# Patient Record
Sex: Female | Born: 1960 | Race: White | Hispanic: No | Marital: Married
Health system: Southern US, Community
[De-identification: ages and names within clinical notes are randomized; demographics above are authoritative.]

## PROBLEM LIST (undated history)

## (undated) DIAGNOSIS — M359 Systemic involvement of connective tissue, unspecified: Secondary | ICD-10-CM

## (undated) DIAGNOSIS — D680 Von Willebrand's disease: Principal | ICD-10-CM

## (undated) DIAGNOSIS — I10 Essential (primary) hypertension: Secondary | ICD-10-CM

## (undated) HISTORY — DX: Systemic involvement of connective tissue, unspecified: M35.9

## (undated) HISTORY — DX: Von Willebrand's disease: D68.0

---

## 2006-06-30 ENCOUNTER — Encounter: Admission: RE | Admit: 2006-06-30 | Discharge: 2006-06-30 | Payer: Self-pay | Admitting: Sports Medicine

## 2006-10-06 ENCOUNTER — Ambulatory Visit: Payer: Self-pay | Admitting: Oncology

## 2006-10-14 ENCOUNTER — Ambulatory Visit (HOSPITAL_BASED_OUTPATIENT_CLINIC_OR_DEPARTMENT_OTHER): Admission: RE | Admit: 2006-10-14 | Discharge: 2006-10-15 | Payer: Self-pay | Admitting: Orthopedic Surgery

## 2008-11-14 ENCOUNTER — Encounter: Admission: RE | Admit: 2008-11-14 | Discharge: 2008-11-14 | Payer: Self-pay | Admitting: Orthopaedic Surgery

## 2010-10-05 NOTE — Op Note (Signed)
[name], [name]           ACCOUNT NO.:  [id number]   MEDICAL RECORD NO.:  [id number]          PATIENT TYPE:  AMB   LOCATION:  DSC                          FACILITY:  [facility]   PHYSICIAN:  Leonides Grills, M.D.     DATE OF BIRTH:  12-14-1960   DATE OF PROCEDURE:  10/14/2006  DATE OF DISCHARGE:                               OPERATIVE REPORT   PREOPERATIVE DIAGNOSES:  1. 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  2. Right intercuneiform arthritis.   POSTOPERATIVE DIAGNOSES:  1. 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  2. Right intercuneiform arthritis.   OPERATION:  1. Right first TMT joint fusion with osteotomy.  2. Right second TMT joint fusion without osteotomy.  3. Right local bone graft.  4. Right intercuneiform fusion.  5. Right stress x-rays foot.   ANESTHESIA:  General with block.   SURGEON:  Leonides Grills, MD   ASSISTANT:  Evlyn Kanner, P.A.   ESTIMATED BLOOD LOSS:  Minimal.   TOURNIQUET TIME:  Approximately an hour and 50 minutes.   COMPLICATIONS:  None.   DISPOSITION:  Stable to PR.   INDICATIONS:  This is a 50 year old female who sustained a Lisfranc  fracture subluxation previously and has since had persistent pain in her  medial right mid foot.  She was consented for the above procedure.  All  risks which include infection, nerve or vessel injury, nonunion,  malunion, hardware irritation, hardware failure, persistent pain, worse  pain, prolonged recovery, stiffness, arthritis, bleeding complications  due to her von Willebrand disease were all explained.  Questions were  encouraged and answered.   OPERATION:  The patient brought to the operating placed in supine  position initially after adequate general G tube anesthesia with block  was administered as well as DDAVP Ancef 1 gram IV piggyback.  The right  lower extremity is prepped and  draped in sterile manner over proximally  thigh tourniquet.  Limb was gravity exsanguinated.  Tourniquet was  elevated to 290 mmHg.  A longitudinal incision midline between the right  EHL and EHB was then made.  Dissection was carried down through skin.  Hemostasis was obtained.  Vascular structures protected throughout the  case.  Interval between EHL and EHB was then developed.  Soft tissue was  elevated off the dorsal aspect of first and second TMT joint as well as  inner cuneiform joint.  The area of the first TMT joint was eroded and  required a subtle osteotomy to correct the first TMT joint in anatomic  position.  This was done with a sagittal saw.  Once this was performed,  remaining cartilage removed with a curved quarter inch osteotome curette  as well as a synovectomy rongeur.  The remaining cartilage from the  inner cuneiform area as well as the second TMT joint was removed with  curved quarter inch osteotome curette and synovectomy rongeur.  Multiple  2-mm drill holes placed on either side of the joint.  First TMT joint  was  reduced anatomically with a two-point reduction clamp.  A bur was  used to create a notch the base of first metatarsal and a 3.5-mm fully  threaded cortical lag screw was then placed using 3.5 and 2.5 mm drill  holes respectively.  This had excellent purchase and compression across  the first TMT joint.  We then made a small incision medially  percutaneous over the medial cuneiform and with a two-point reduction  clamp, the second TMT joint was anatomically compressed and reduced.  A  3.5-mm fully threaded cortical set screw then placed using 2.5-mm drill  hole respectively.  This excellent purchase and maintenance of the  compression across fusion site.  We then placed two-point reduction  clamp across the intercuneiform area and then through the dorsal wound a  2.5 mm drill hole was placed across the intercuneiform area and a 3.5-mm  fully threaded  cortical set screw was placed using a 2.5 mm drill hole  respectively.  This had excellent purchase and maintenance of the  compression. Local bone graft obtained throughout the procedure from the  spur as well as osteotomy and the drills were then placed on back table  and then stress strain relieving bone graft was applied to all fusion  surfaces dorsally using a bur.  Final stress x-rays were obtained AP  lateral and oblique planes.  This showed no gross motion across the  fusion site, fixation in proper position excellent alignment as well.  The tourniquet was deflated, hemostasis was obtained.  Prior to bone  grafting the area was copiously irrigated normal saline.  Subcu was  closed 3-0 Vicryl, skin was closed 4-0 nylon over all wounds.  Sterile  dressing was applied.  Modified Jones dressing was applied ankle in  neutral dorsiflexion.  Patient stable to PR.   POSTOP COURSE:  The patient will be admitted overnight.  She is to  receive DDAVP 12 hours post surgery which will be 3 a.m. next day and  received a final dose of DDAVP 29 mcg 24 hours after surgery.  This was  arranged by home health.  She will then follow up in 2 weeks at that  time remove the dressing as well as suture.  She will then go into short-  leg nonweightbearing cast which she will wear for 6 weeks.  At two  months postop, she will go into a Cam walker boot and weight bear as  tolerated and again get compression stocking.  She will undergo therapy  for active passive range of motion excise, the ankle talar and MTP  joints.  At the end of 3 months she will go into a normal shoe, at the  end of four months she will require custom-made orthotic.  Elevation  active range of motion toes were encouraged.      Leonides Grills, M.D.  Electronically Signed     PB/MEDQ  D:  10/14/2006  T:  10/14/2006  Job:  [id number]   cc:   Weston Settle, MD

## 2013-07-05 DIAGNOSIS — I10 Essential (primary) hypertension: Secondary | ICD-10-CM | POA: Insufficient documentation

## 2013-07-05 DIAGNOSIS — R000 Tachycardia, unspecified: Secondary | ICD-10-CM | POA: Insufficient documentation

## 2013-07-05 DIAGNOSIS — G894 Chronic pain syndrome: Secondary | ICD-10-CM | POA: Insufficient documentation

## 2013-07-05 DIAGNOSIS — M545 Low back pain, unspecified: Secondary | ICD-10-CM | POA: Insufficient documentation

## 2013-08-02 DIAGNOSIS — H43819 Vitreous degeneration, unspecified eye: Secondary | ICD-10-CM | POA: Insufficient documentation

## 2013-09-15 DIAGNOSIS — H539 Unspecified visual disturbance: Secondary | ICD-10-CM | POA: Insufficient documentation

## 2013-09-15 DIAGNOSIS — Z9889 Other specified postprocedural states: Secondary | ICD-10-CM | POA: Insufficient documentation

## 2013-09-15 DIAGNOSIS — H5710 Ocular pain, unspecified eye: Secondary | ICD-10-CM | POA: Insufficient documentation

## 2013-10-27 DIAGNOSIS — G894 Chronic pain syndrome: Secondary | ICD-10-CM | POA: Insufficient documentation

## 2013-10-27 DIAGNOSIS — M797 Fibromyalgia: Secondary | ICD-10-CM | POA: Insufficient documentation

## 2013-10-27 DIAGNOSIS — M7918 Myalgia, other site: Secondary | ICD-10-CM | POA: Insufficient documentation

## 2013-10-27 DIAGNOSIS — M359 Systemic involvement of connective tissue, unspecified: Secondary | ICD-10-CM

## 2013-10-27 DIAGNOSIS — M2550 Pain in unspecified joint: Secondary | ICD-10-CM | POA: Insufficient documentation

## 2013-10-27 HISTORY — DX: Systemic involvement of connective tissue, unspecified: M35.9

## 2014-02-14 DIAGNOSIS — G43109 Migraine with aura, not intractable, without status migrainosus: Secondary | ICD-10-CM | POA: Insufficient documentation

## 2016-03-20 ENCOUNTER — Telehealth: Payer: Self-pay | Admitting: *Deleted

## 2016-03-20 NOTE — Telephone Encounter (Signed)
error 

## 2016-04-05 ENCOUNTER — Ambulatory Visit (HOSPITAL_BASED_OUTPATIENT_CLINIC_OR_DEPARTMENT_OTHER): Payer: BC Managed Care – PPO | Admitting: Hematology & Oncology

## 2016-04-05 ENCOUNTER — Ambulatory Visit: Payer: BC Managed Care – PPO

## 2016-04-05 ENCOUNTER — Encounter: Payer: Self-pay | Admitting: Hematology & Oncology

## 2016-04-05 ENCOUNTER — Other Ambulatory Visit (HOSPITAL_BASED_OUTPATIENT_CLINIC_OR_DEPARTMENT_OTHER): Payer: BC Managed Care – PPO

## 2016-04-05 VITALS — BP 142/72 | HR 76 | Temp 97.7°F | Resp 20 | Wt 183.1 lb

## 2016-04-05 DIAGNOSIS — D680 Von Willebrand's disease: Secondary | ICD-10-CM

## 2016-04-05 DIAGNOSIS — D689 Coagulation defect, unspecified: Secondary | ICD-10-CM

## 2016-04-05 DIAGNOSIS — D6801 Von willebrand disease, type 1: Secondary | ICD-10-CM

## 2016-04-05 HISTORY — DX: Von Willebrand's disease: D68.0

## 2016-04-05 HISTORY — DX: Von willebrand disease, type 1: D68.01

## 2016-04-05 LAB — CBC WITH DIFFERENTIAL (CANCER CENTER ONLY)
BASO#: 0 10*3/uL (ref 0.0–0.2)
BASO%: 0.5 % (ref 0.0–2.0)
EOS%: 2.9 % (ref 0.0–7.0)
Eosinophils Absolute: 0.2 10*3/uL (ref 0.0–0.5)
HEMATOCRIT: 39.5 % (ref 34.8–46.6)
HEMOGLOBIN: 13.4 g/dL (ref 11.6–15.9)
LYMPH#: 1.9 10*3/uL (ref 0.9–3.3)
LYMPH%: 29 % (ref 14.0–48.0)
MCH: 30.3 pg (ref 26.0–34.0)
MCHC: 33.9 g/dL (ref 32.0–36.0)
MCV: 89 fL (ref 81–101)
MONO#: 0.5 10*3/uL (ref 0.1–0.9)
MONO%: 8 % (ref 0.0–13.0)
NEUT%: 59.6 % (ref 39.6–80.0)
NEUTROS ABS: 3.9 10*3/uL (ref 1.5–6.5)
Platelets: 350 10*3/uL (ref 145–400)
RBC: 4.42 10*6/uL (ref 3.70–5.32)
RDW: 12.1 % (ref 11.1–15.7)
WBC: 6.6 10*3/uL (ref 3.9–10.0)

## 2016-04-06 LAB — VON WILLEBRAND PANEL
FACTOR VIII ACTIVITY: 82 % (ref 57–163)
vWF Activity: 72 % (ref 50–200)
von Willebrand Factor (vWF) Ag: 92 % (ref 50–200)

## 2016-04-06 LAB — PROTHROMBIN TIME (PT)
INR: 1.1 (ref 0.8–1.2)
Prothrombin Time: 11.9 s (ref 9.1–12.0)

## 2016-04-06 LAB — APTT: APTT: 27 s (ref 24–33)

## 2016-04-08 ENCOUNTER — Ambulatory Visit (HOSPITAL_BASED_OUTPATIENT_CLINIC_OR_DEPARTMENT_OTHER): Payer: BC Managed Care – PPO | Admitting: Hematology & Oncology

## 2016-04-08 ENCOUNTER — Other Ambulatory Visit: Payer: Self-pay | Admitting: Hematology & Oncology

## 2016-04-08 ENCOUNTER — Encounter: Payer: Self-pay | Admitting: *Deleted

## 2016-04-08 ENCOUNTER — Other Ambulatory Visit (HOSPITAL_COMMUNITY)
Admission: RE | Admit: 2016-04-08 | Discharge: 2016-04-08 | Disposition: A | Discharge status: Home / Self Care | Payer: BC Managed Care – PPO | Source: Ambulatory Visit | Attending: Hematology & Oncology | Admitting: Hematology & Oncology

## 2016-04-08 ENCOUNTER — Ambulatory Visit (HOSPITAL_BASED_OUTPATIENT_CLINIC_OR_DEPARTMENT_OTHER)
Admission: RE | Admit: 2016-04-08 | Discharge: 2016-04-08 | Disposition: A | Discharge status: Home / Self Care | Payer: BC Managed Care – PPO | Source: Ambulatory Visit | Attending: Hematology & Oncology | Admitting: Hematology & Oncology

## 2016-04-08 ENCOUNTER — Encounter (HOSPITAL_BASED_OUTPATIENT_CLINIC_OR_DEPARTMENT_OTHER): Payer: Self-pay

## 2016-04-08 ENCOUNTER — Ambulatory Visit (HOSPITAL_BASED_OUTPATIENT_CLINIC_OR_DEPARTMENT_OTHER): Payer: BC Managed Care – PPO

## 2016-04-08 DIAGNOSIS — E0789 Other specified disorders of thyroid: Secondary | ICD-10-CM

## 2016-04-08 DIAGNOSIS — D680 Von Willebrand's disease: Secondary | ICD-10-CM | POA: Insufficient documentation

## 2016-04-08 DIAGNOSIS — D6801 Von willebrand disease, type 1: Secondary | ICD-10-CM

## 2016-04-08 DIAGNOSIS — D689 Coagulation defect, unspecified: Secondary | ICD-10-CM

## 2016-04-08 DIAGNOSIS — E042 Nontoxic multinodular goiter: Secondary | ICD-10-CM | POA: Insufficient documentation

## 2016-04-08 DIAGNOSIS — E079 Disorder of thyroid, unspecified: Secondary | ICD-10-CM

## 2016-04-08 HISTORY — DX: Essential (primary) hypertension: I10

## 2016-04-08 LAB — PLATELET FUNCTION ASSAY: COLLAGEN / ADP: 97 s (ref 0–118)

## 2016-04-08 MED ORDER — IOPAMIDOL (ISOVUE-300) INJECTION 61%
100.0000 mL | Freq: Once | INTRAVENOUS | Status: AC | PRN
  Administered 2016-04-08: 100 mL via INTRAVENOUS

## 2016-04-08 NOTE — Progress Notes (Signed)
Referral MD  Reason for Referral: Bleeding disorder - Patient needs an EGD  Chief Complaint  Patient presents with  . Initial Assessment    Clotting Issues  : I have a problem bleeding when I have procedures.  HPI: [name] is well-known to me. She is the wife of my patient. She is 55 years old. She needs to have an upper endoscopy and biopsy. I'm assured that she has some type of polyp or pathology in the upper abdomen.  She says that she has had issues with bleeding in the past. She did have a thyroid biopsy back in August 2015. As far as I can tell, there was no issues with bleeding from this. She has fibro-myalgia. She has had injections for this. Again I don't see any obvious bleeding complication.  From the records that are in the system, I do not see any coagulation studies.  She says that she has some kind of bleeding problem. In the chart, she is recorded as having von Willebrand's. I have not seen anything in the past office notes from other doctors about her having von Willebrand's.   She says that she has had a lot of bleeding with procedures. She has had I think a couple C-sections.  She says that she almost died from bleeding.   I'm not sure if there is anybody in her family that is a free bleeder. She says that her mother may have had some bleeding problems.   [facility].   Her performance status is ECOG 0.    Past Medical History:  Diagnosis Date  . Von Willebrand disease, type 1a (HCC) 04/05/2016  :  No past surgical history on file.:   Current Outpatient Prescriptions:  .  carisoprodol (SOMA) 250 MG tablet, Take 250 mg by mouth daily., Disp: , Rfl:  .  clonazePAM (KLONOPIN) 1 MG tablet, Take 1 mg by mouth., Disp: , Rfl:  .  COMFORT GEL ANTACID & ANTI-GAS 200-200-20 MG/5ML suspension, , Disp: , Rfl:  .  DEXILANT 60 MG capsule, 60 mg daily., Disp: , Rfl:  .  DULoxetine (CYMBALTA) 60 MG capsule,  Take 60 mg by mouth daily. , Disp: , Rfl:  .  EPINEPHrine 0.3 mg/0.3 mL IJ SOAJ injection, Inject into the muscle., Disp: , Rfl:  .  hydrochlorothiazide (HYDRODIURIL) 25 MG tablet, Take 25 mg by mouth daily., Disp: , Rfl:  .  lidocaine (LIDODERM) 5 %, APPLY 1 PATCH BY TRANSDERMAL ROUTE EVERY DAY (MAY WEAR UP TO 12HOURS.), Disp: , Rfl:  .  LINZESS 290 MCG CAPS capsule, Take 290 mcg by mouth daily., Disp: , Rfl: 2 .  metoprolol succinate (TOPROL-XL) 25 MG 24 hr tablet, Take 25 mg by mouth., Disp: , Rfl:  .  omeprazole (PRILOSEC) 40 MG capsule, Take 40 mg by mouth daily., Disp: , Rfl: :  :  Allergies  Allergen Reactions  . Bee Venom Anaphylaxis  . Meperidine Rash and Swelling  . Methocarbamol Nausea And Vomiting    Other reaction(s): Dizziness (intolerance)  . Nitrofurantoin Rash  . Oxycodone Nausea And Vomiting  . Aspirin Other (See Comments)    Patient reports this causes severe bleeding with this med  . Hydrocortisone Hives  . Ibuprofen Other (See Comments)    Cannot take d/t clotting issues  . Gabapentin Nausea Only  . Penicillin G Rash  . Topiramate Rash    Finger bleeding  :  No family history on file.:  Social History   Social History  . Marital status: Married    Spouse name: N/A  . Number of children: N/A  . Years of education: N/A   Occupational History  . Not on file.   Social History Main Topics  . Smoking status: Not on file  . Smokeless tobacco: Not on file  . Alcohol use Not on file  . Drug use: Unknown  . Sexual activity: Not on file   Other Topics Concern  . Not on file   Social History Narrative  . No narrative on file  :  Pertinent items are noted in HPI.  Exam: @IPVITALS @  Well-developed and well-nourished white female in no obvious distress. Vital signs show a temperature of 97.7. Pulse 76. Blood pressure 142/72. Weight is 183 pounds. Head and neck exam shows no ocular or oral lesions. There are no palpable cervical or supraclavicular  lymph nodes. Lungs are clear bilaterally. Cardiac exam regular rate and rhythm with no murmurs, rubs or bruits. Abdomen is soft. She has good bowel sounds. There may be some slight discomfort in the upper abdomen to palpation. There is no palpable liver or spleen tip. There is no abdominal mass. Back exam shows no tenderness over the spine, ribs or hips. Extremities shows no clubbing, cyanosis or edema. Skin exam shows no rashes, ecchymoses or petechia. Neurological exam shows no focal neurological deficits.  Recent Labs  04/05/16 1324  WBC 6.6  HGB 13.4  HCT 39.5  PLT 350   No results for input(s): NA, K, CL, CO2, GLUCOSE, BUN, CREATININE, CALCIUM in the last 72 hours.  Blood smear review:  Normochromic and normocytic population of red blood cells. She has no nucleated red blood cells. There are no target cells. She has no schistocytes or spherocytes. There are no nucleated red blood cells. White cells appear normal in morphology and maturation. There is no hypersegmented polys. There is no immature myeloid or lymphoid forms. Platelets are adequate in number and size. Platelets are well granulated.  Pathology: None     Assessment and Plan:  [name] is a 55 year old white female with some undefined bleeding disorder. She has had past procedures from a what I can tell in the medical record. I don't see any problems with her having these procedures. However, she says that she has had a lot of bleeding.  Does not look like that she has von Willebrand's disease. Her factor VIII level is 82%. Her von Willebrand factor is 92%.  She has a normal prothrombin time and PTT.  I'm not sure exactly what might be going on. I would not think that she is a factor VIII carrier for hemophilia a. If so, that her factor VIII level would be a lot lower.  We will have to check her platelet function studies. I will get a platelet function assay on her.  Since she says that this happens with surgery, I  will check a factor XIII level.  I will also check factor XI and factor IX.  Her blood looked okay under the microscope. I'll see anything that looked suspicious.  We will have to await the results from the additional studies nor to make recommendations.  I spent about 45 minutes with she and her husband. He is doing fairly well right now. They brought in a grandson which they are really happy about.

## 2016-04-08 NOTE — Progress Notes (Signed)
Patient here for labs.  Presents with mass on left side of her neck that popped up yesterday.  Dr. Myna HidalgoEnnever notified.  Dr. Bea LauraE examined patient then sent her for CT scan downstairs.  Dr. Myna Hidalgoennever contacted Dr. Ashok CroonYeh from [place] to consult for ruptured goiter. Dr. Ashok CroonYeh recommended ultrasound .  Dr. Myna HidalgoEnnever put orders in.

## 2016-04-08 NOTE — Progress Notes (Signed)
Hematology and Oncology Follow Up Visit  [name] [id number]  55 y.o. 04/08/2016   Principle Diagnosis:   Left thyroid enlargement  Current Therapy:    Observation     Interim History:  [name] is in for a scheduled visit. I actually saw her for the first time last week. She had some kind of bleeding disorder. She is supposed to have a upper endoscopy but needed to be cleared to have this done.  She, over the weekend, felt that her left thyroid lobe was enlarging. She has had thyroid nodules in the past. [facility]. She had biopsies which were all negative.  She denies incontinence of shortness of breath. She's had no dysphagia or odynophagia.  We did go ahead and get a CT scan. The CT scan showed a 4 cm nodule. He was heterogeneous. 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  She has no hoarseness. She has no mouth sores. She has a little bit of discomfort in that area of enlargement.  Medications:  Current Outpatient Prescriptions:  .  carisoprodol (SOMA) 250 MG tablet, Take 250 mg by mouth daily., Disp: , Rfl:  .  clonazePAM (KLONOPIN) 1 MG tablet, Take 1 mg by mouth., Disp: , Rfl:  .  COMFORT GEL ANTACID & ANTI-GAS 200-200-20 MG/5ML suspension, , Disp: , Rfl:  .  DEXILANT 60 MG capsule, 60 mg daily., Disp: , Rfl:  .  DULoxetine (CYMBALTA) 60 MG capsule, Take 60 mg by mouth daily. , Disp: , Rfl:  .  EPINEPHrine 0.3 mg/0.3 mL IJ SOAJ injection, Inject into the muscle., Disp: , Rfl:  .  hydrochlorothiazide (HYDRODIURIL) 25 MG tablet, Take 25 mg by mouth daily., Disp: , Rfl:  .  lidocaine (LIDODERM) 5 %, APPLY 1 PATCH BY TRANSDERMAL ROUTE EVERY DAY (MAY WEAR UP TO 12HOURS.), Disp: , Rfl:  .  LINZESS 290 MCG CAPS capsule, Take 290 mcg by mouth daily., Disp: , Rfl: 2 .  metoprolol succinate (TOPROL-XL) 25 MG 24 hr tablet, Take 25 mg by mouth., Disp: , Rfl:  .  omeprazole (PRILOSEC) 40 MG capsule, Take 40 mg by mouth  daily., Disp: , Rfl:   Allergies:  Allergies  Allergen Reactions  . Bee Venom Anaphylaxis  . Meperidine Rash and Swelling  . Methocarbamol Nausea And Vomiting    Other reaction(s): Dizziness (intolerance)  . Nitrofurantoin Rash  . Oxycodone Nausea And Vomiting  . Aspirin Other (See Comments)    Patient reports this causes severe bleeding with this med  . Hydrocortisone Hives  . Ibuprofen Other (See Comments)    Cannot take d/t clotting issues  . Gabapentin Nausea Only  . Penicillin G Rash  . Topiramate Rash    Finger bleeding    Past Medical History, Surgical history, Social history, and Family History were reviewed and updated.  Review of Systems: As above  Physical Exam:  vitals were not taken for this visit.  Wt Readings from Last 3 Encounters:  04/05/16 183 lb 1.9 oz (83.1 kg)     Well-developed and well-nourished white female. Head and neck exam does show a somewhat firm non-mobile mass just left of the midline. This is in the area of the left lobe of the thyroid. It is slightly tender to palpation. No obvious adenopathy is noted in the neck. Her lungs are clear. There is no stridor. She has no wheezes. Cardiac exam regular rate and rhythm with no murmurs, rubs or bruits.  Axillary exam shows no left axillary adenopathy.  Lab Results  Component Value Date   WBC 6.6 04/05/2016   HGB 13.4 04/05/2016   HCT 39.5 04/05/2016   MCV 89 04/05/2016   PLT 350 04/05/2016     Chemistry   No results found for: NA, K, CL, CO2, BUN, CREATININE, GLU No results found for: CALCIUM, ALKPHOS, AST, ALT, BILITOT       Impression and Plan: [name] is a 55 year old white female. 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 I must say that this is quite unusual. She is not on aspirin. She does not take any kind of non-steroidal.  Her platelet function assay seems that show a platelet dysfunction based on medication. Again I don't see any medication that she is on that  might cause platelet dysfunction.  I spoke to her surgeon that she had seen in [place]. The surgeon did not think that there is anything that needed to be done invasively. She suggested a thyroid ultrasound. I'll see back in one set up for tomorrow.  I told her to put an ice pack on this area. This may help a little bit.  Otherwise, I think if she begins having issues, that she may need to go to her surgeon to see about having this aspirated.  I spent about 35 minutes with her today.   Josph MachoENNEVER,PETER R, MD 11/20/20173:05 PM

## 2016-04-09 LAB — FACTOR 9 ASSAY: FACTOR IX ACTIVITY: 169 % (ref 60–177)

## 2016-04-09 LAB — FACTOR 7 ASSAY: Factor VII Activity: 69 % (ref 51–186)

## 2016-04-09 LAB — FACTOR 11 ASSAY: FACTOR XI ACTIVITY: 109 % (ref 60–150)

## 2016-04-09 LAB — FACTOR 8 ASSAY: Factor VIII Activity: 118 % (ref 57–163)

## 2016-04-10 LAB — FACTOR 13 ACTIVITY: FACTOR XIII: NORMAL

## 2016-04-12 ENCOUNTER — Ambulatory Visit (HOSPITAL_BASED_OUTPATIENT_CLINIC_OR_DEPARTMENT_OTHER)
Admission: RE | Admit: 2016-04-12 | Discharge: 2016-04-12 | Disposition: A | Discharge status: Home / Self Care | Payer: BC Managed Care – PPO | Source: Ambulatory Visit | Attending: Hematology & Oncology | Admitting: Hematology & Oncology

## 2016-04-12 DIAGNOSIS — E079 Disorder of thyroid, unspecified: Secondary | ICD-10-CM

## 2016-05-09 ENCOUNTER — Other Ambulatory Visit: Payer: Self-pay | Admitting: *Deleted

## 2016-05-10 ENCOUNTER — Ambulatory Visit (HOSPITAL_BASED_OUTPATIENT_CLINIC_OR_DEPARTMENT_OTHER): Payer: BC Managed Care – PPO | Admitting: Hematology & Oncology

## 2016-05-10 ENCOUNTER — Encounter: Payer: Self-pay | Admitting: Hematology & Oncology

## 2016-05-10 ENCOUNTER — Other Ambulatory Visit (HOSPITAL_COMMUNITY)
Admission: RE | Admit: 2016-05-10 | Discharge: 2016-05-10 | Disposition: A | Discharge status: Home / Self Care | Payer: BC Managed Care – PPO | Source: Ambulatory Visit | Attending: Hematology & Oncology | Admitting: Hematology & Oncology

## 2016-05-10 ENCOUNTER — Other Ambulatory Visit (HOSPITAL_BASED_OUTPATIENT_CLINIC_OR_DEPARTMENT_OTHER): Payer: BC Managed Care – PPO

## 2016-05-10 VITALS — BP 135/74 | HR 59 | Temp 98.2°F | Resp 16 | Wt 184.0 lb

## 2016-05-10 DIAGNOSIS — D689 Coagulation defect, unspecified: Secondary | ICD-10-CM | POA: Diagnosis not present

## 2016-05-10 DIAGNOSIS — D680 Von Willebrand's disease: Secondary | ICD-10-CM

## 2016-05-10 DIAGNOSIS — E018 Other iodine-deficiency related thyroid disorders and allied conditions: Secondary | ICD-10-CM | POA: Diagnosis not present

## 2016-05-10 DIAGNOSIS — D6801 Von willebrand disease, type 1: Secondary | ICD-10-CM

## 2016-05-10 LAB — CBC WITH DIFFERENTIAL (CANCER CENTER ONLY)
BASO#: 0 10*3/uL (ref 0.0–0.2)
BASO%: 0.5 % (ref 0.0–2.0)
EOS%: 2.6 % (ref 0.0–7.0)
Eosinophils Absolute: 0.2 10*3/uL (ref 0.0–0.5)
HEMATOCRIT: 42.4 % (ref 34.8–46.6)
HEMOGLOBIN: 14.5 g/dL (ref 11.6–15.9)
LYMPH#: 1.6 10*3/uL (ref 0.9–3.3)
LYMPH%: 26.5 % (ref 14.0–48.0)
MCH: 30.1 pg (ref 26.0–34.0)
MCHC: 34.2 g/dL (ref 32.0–36.0)
MCV: 88 fL (ref 81–101)
MONO#: 0.5 10*3/uL (ref 0.1–0.9)
MONO%: 8.2 % (ref 0.0–13.0)
NEUT%: 62.2 % (ref 39.6–80.0)
NEUTROS ABS: 3.8 10*3/uL (ref 1.5–6.5)
Platelets: 339 10*3/uL (ref 145–400)
RBC: 4.82 10*6/uL (ref 3.70–5.32)
RDW: 11.9 % (ref 11.1–15.7)
WBC: 6.1 10*3/uL (ref 3.9–10.0)

## 2016-05-10 LAB — PLATELET FUNCTION ASSAY: Collagen / Epinephrine: 133 seconds (ref 0–193)

## 2016-05-10 LAB — PROTIME-INR (CHCC SATELLITE)
INR: 1.1 — AB (ref 2.0–3.5)
PROTIME: 13.2 s (ref 10.6–13.4)

## 2016-05-11 LAB — APTT: APTT: 27 s (ref 24–33)

## 2016-05-16 LAB — PLATELET FUNCTION ASSAY

## 2016-05-21 ENCOUNTER — Other Ambulatory Visit: Payer: Self-pay | Admitting: *Deleted

## 2016-05-21 ENCOUNTER — Other Ambulatory Visit: Payer: Self-pay | Admitting: Hematology & Oncology

## 2016-05-21 ENCOUNTER — Ambulatory Visit (HOSPITAL_COMMUNITY)
Admission: RE | Admit: 2016-05-21 | Discharge: 2016-05-21 | Disposition: A | Discharge status: Home / Self Care | Payer: BC Managed Care – PPO | Source: Ambulatory Visit | Attending: Hematology & Oncology | Admitting: Hematology & Oncology

## 2016-05-21 DIAGNOSIS — D696 Thrombocytopenia, unspecified: Secondary | ICD-10-CM

## 2016-05-21 DIAGNOSIS — D6801 Von willebrand disease, type 1: Secondary | ICD-10-CM

## 2016-05-21 DIAGNOSIS — D680 Von Willebrand's disease: Secondary | ICD-10-CM | POA: Insufficient documentation

## 2016-05-21 NOTE — Progress Notes (Signed)
Hematology and Oncology Follow Up Visit  Oralia Ruderessa K Sachdev [id number] 06/09/1960 56 y.o. 05/21/2016   Principle Diagnosis:   Left thyroid enlargement  Current Therapy:    Observation     Interim History:  Ms. Clint LippsClippinger is in for a scheduled visit. I had her come in so we could discuss how we could help with her upper endoscopy. She is scheduled to have an upper endoscopy in January. I'm not sure when in January this will be.   We saw her a couple weeks ago. Her platelet function assay was abnormal. I'm unsure as to why it would be abnormal.   Her coagulation factor assays were all okay.  I repeated the platelet function assay. This was now normal.   Her pro time and PTT were also okay.   Given the history however, there must be some bleeding issue. We just have not been noted to uncover it with all of our studies.   She has no obvious bleeding that is spontaneous.  She is not a vegetarian. She is not taking aspirin. She is not taking nonsteroidals.  Medications:  Current Outpatient Prescriptions:  .  carisoprodol (SOMA) 250 MG tablet, Take 250 mg by mouth daily., Disp: , Rfl:  .  clonazePAM (KLONOPIN) 1 MG tablet, Take 1 mg by mouth., Disp: , Rfl:  .  COMFORT GEL ANTACID & ANTI-GAS 200-200-20 MG/5ML suspension, , Disp: , Rfl:  .  DEXILANT 60 MG capsule, 60 mg daily., Disp: , Rfl:  .  DULoxetine (CYMBALTA) 60 MG capsule, Take 60 mg by mouth daily. , Disp: , Rfl:  .  EPINEPHrine 0.3 mg/0.3 mL IJ SOAJ injection, Inject into the muscle., Disp: , Rfl:  .  hydrochlorothiazide (HYDRODIURIL) 25 MG tablet, Take 25 mg by mouth daily., Disp: , Rfl:  .  HYDROcodone-acetaminophen (NORCO) 10-325 MG tablet, Take 1 tablet by mouth every 8 (eight) hours as needed. for pain, Disp: , Rfl: 0 .  lidocaine (LIDODERM) 5 %, APPLY 1 PATCH BY TRANSDERMAL ROUTE EVERY DAY (MAY WEAR UP TO 12HOURS.), Disp: , Rfl:  .  LINZESS 290 MCG CAPS capsule, Take 290 mcg by mouth daily., Disp: , Rfl: 2 .   metoprolol succinate (TOPROL-XL) 25 MG 24 hr tablet, Take 25 mg by mouth., Disp: , Rfl:  .  omeprazole (PRILOSEC) 40 MG capsule, Take 40 mg by mouth daily., Disp: , Rfl:   Allergies:  Allergies  Allergen Reactions  . Bee Venom Anaphylaxis  . Meperidine Rash and Swelling  . Methocarbamol Nausea And Vomiting    Other reaction(s): Dizziness (intolerance)  . Nitrofurantoin Rash  . Oxycodone Nausea And Vomiting  . Aspirin Other (See Comments)    Patient reports this causes severe bleeding with this med  . Hydrocortisone Hives  . Ibuprofen Other (See Comments)    Cannot take d/t clotting issues  . Gabapentin Nausea Only  . Penicillin G Rash  . Topiramate Rash    Finger bleeding    Past Medical History, Surgical history, Social history, and Family History were reviewed and updated.  Review of Systems: As above  Physical Exam:  weight is 184 lb (83.5 kg). Her oral temperature is 98.2 F (36.8 C). Her blood pressure is 135/74 and her pulse is 59 (abnormal). Her respiration is 16 and oxygen saturation is 97%.   Wt Readings from Last 3 Encounters:  05/10/16 184 lb (83.5 kg)  04/05/16 183 lb 1.9 oz (83.1 kg)     Well-developed and well-nourished white female. Head and neck  exam does show a somewhat firm non-mobile mass just left of the midline. This is in the area of the left lobe of the thyroid. It is slightly tender to palpation. No obvious adenopathy is noted in the neck. Her lungs are clear. There is no stridor. She has no wheezes. Cardiac exam regular rate and rhythm with no murmurs, rubs or bruits. Axillary exam shows no left axillary adenopathy.  Lab Results  Component Value Date   WBC 6.1 05/10/2016   HGB 14.5 05/10/2016   HCT 42.4 05/10/2016   MCV 88 05/10/2016   PLT 339 Platelet count consistent in citrate 05/10/2016     Chemistry   No results found for: NA, K, CL, CO2, BUN, CREATININE, GLU No results found for: CALCIUM, ALKPHOS, AST, ALT, BILITOT       Impression  and Plan: Ms. Ozawa is a 56 year old white female. 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 I must say that this is quite unusual. She is not on aspirin. She does not take any kind of non-steroidal.  Her platelet function assay was normal that we repeated. I'm not sure as to why this was normal or why the previous platelet function test was abnormal.   Regardless, I think we probably have to give her some platelets before the procedure.  I think the procedure will be done the first week in January. As such, we will have to have her come in that morning. I will speak with her gastroenterologist so that he can coordinate the procedure for upper endoscopy to be done that afternoon. I think this would be the safest way to try to minimize her bleeding risk.   I spent about 35 minutes with her today.   Josph Macho, MD 1/2/20188:34 AM

## 2016-05-23 ENCOUNTER — Other Ambulatory Visit: Payer: Self-pay | Admitting: *Deleted

## 2016-05-23 ENCOUNTER — Other Ambulatory Visit (HOSPITAL_BASED_OUTPATIENT_CLINIC_OR_DEPARTMENT_OTHER): Payer: BC Managed Care – PPO

## 2016-05-23 DIAGNOSIS — D696 Thrombocytopenia, unspecified: Secondary | ICD-10-CM | POA: Diagnosis present

## 2016-05-23 DIAGNOSIS — D689 Coagulation defect, unspecified: Secondary | ICD-10-CM

## 2016-05-23 DIAGNOSIS — E0789 Other specified disorders of thyroid: Secondary | ICD-10-CM

## 2016-05-23 DIAGNOSIS — D680 Von Willebrand's disease: Secondary | ICD-10-CM

## 2016-05-23 DIAGNOSIS — D6801 Von willebrand disease, type 1: Secondary | ICD-10-CM

## 2016-05-23 LAB — ABO/RH: ABO/RH(D): O POS

## 2016-05-24 ENCOUNTER — Ambulatory Visit (HOSPITAL_BASED_OUTPATIENT_CLINIC_OR_DEPARTMENT_OTHER): Payer: BC Managed Care – PPO

## 2016-05-24 DIAGNOSIS — D689 Coagulation defect, unspecified: Secondary | ICD-10-CM | POA: Diagnosis not present

## 2016-05-24 DIAGNOSIS — D680 Von Willebrand's disease: Secondary | ICD-10-CM

## 2016-05-24 DIAGNOSIS — D6801 Von willebrand disease, type 1: Secondary | ICD-10-CM

## 2016-05-24 MED ORDER — SODIUM CHLORIDE 0.9 % IV SOLN
250.0000 mL | Freq: Once | INTRAVENOUS | Status: AC
  Administered 2016-05-24: 250 mL via INTRAVENOUS

## 2016-05-24 MED ORDER — ACETAMINOPHEN 325 MG PO TABS
650.0000 mg | ORAL_TABLET | Freq: Once | ORAL | Status: AC
  Administered 2016-05-24: 650 mg via ORAL

## 2016-05-24 MED ORDER — ACETAMINOPHEN 325 MG PO TABS
ORAL_TABLET | ORAL | Status: AC
  Filled 2016-05-24: qty 2

## 2016-05-24 MED ORDER — DIPHENHYDRAMINE HCL 25 MG PO CAPS
ORAL_CAPSULE | ORAL | Status: AC
  Filled 2016-05-24: qty 1

## 2016-05-24 MED ORDER — DIPHENHYDRAMINE HCL 25 MG PO CAPS
25.0000 mg | ORAL_CAPSULE | Freq: Once | ORAL | Status: AC
  Administered 2016-05-24: 25 mg via ORAL

## 2016-05-24 NOTE — Patient Instructions (Signed)
Platelet Transfusion, Care After Introduction Refer to this sheet in the next few weeks. These instructions provide you with information about caring for yourself after your procedure. Your health care provider may also give you more specific instructions. Your treatment has been planned according to current medical practices, but problems sometimes occur. Call your health care provider if you have any problems or questions after your procedure. What can I expect after the procedure? After the procedure, it is common to have:  Bruising and soreness at the IV site.  Fever or chills within the first 48 hours of your transfusion. Follow these instructions at home:  Take medicines only as directed by your health care provider. Ask your health care provider if you can take an over-the-counter pain reliever in case you have a fever or headache a day or two after your transfusion.  Return to your normal activities as directed by your health care provider. Contact a health care provider if:  You have a fever.  You have a headache.  You have redness, swelling, or pain at your IV site.  You have skin itching or a rash.  You vomit.  You feel unusually tired or weak. Get help right away if:  You have trouble breathing.  You have a decreased amount of urine or you urinate less often than you normally do.  Your urine is darker than normal.  You have pain in your back, abdomen, or chest.  You have cool, clammy skin.  You have a rapid heartbeat. This information is not intended to replace advice given to you by your health care provider. Make sure you discuss any questions you have with your health care provider. Document Released: 05/27/2014 Document Revised: 10/12/2015 Document Reviewed: 03/16/2014  2017 [employer]vier  

## 2016-05-27 ENCOUNTER — Encounter: Payer: Self-pay | Admitting: Hematology & Oncology

## 2016-05-27 LAB — PREPARE PLATELET PHERESIS
Blood Product Expiration Date: 201801062359
ISSUE DATE / TIME: 201801050831
Unit Type and Rh: 5100

## 2017-11-13 ENCOUNTER — Telehealth: Payer: Self-pay | Admitting: Hematology & Oncology

## 2017-11-13 NOTE — Telephone Encounter (Signed)
[facility]   for   [facility]ESA [facility] 06/07/1960 CASE: [id number]      COPY SCANNED

## 2018-06-09 ENCOUNTER — Other Ambulatory Visit: Payer: Self-pay | Admitting: Orthopaedic Surgery

## 2018-06-09 DIAGNOSIS — M25562 Pain in left knee: Secondary | ICD-10-CM

## 2018-06-26 ENCOUNTER — Other Ambulatory Visit: Payer: BC Managed Care – PPO

## 2018-07-06 ENCOUNTER — Other Ambulatory Visit: Payer: BC Managed Care – PPO

## 2018-09-09 IMAGING — US US THYROID
1 series · 12 of 25 positions shown · non-contrast
Comparison: 04/08/2016

CLINICAL DATA: Incidental on CT. Multi nodular goiter identified on
recent neck CT. Difficulty swallowing.

EXAM:
THYROID ULTRASOUND
TECHNIQUE: Ultrasound examination of the thyroid gland and adjacent soft
tissues was performed.

[Series 1: us thyroid · 0.07mm/px · 12 of 75 slices shown]
[im 4/75]
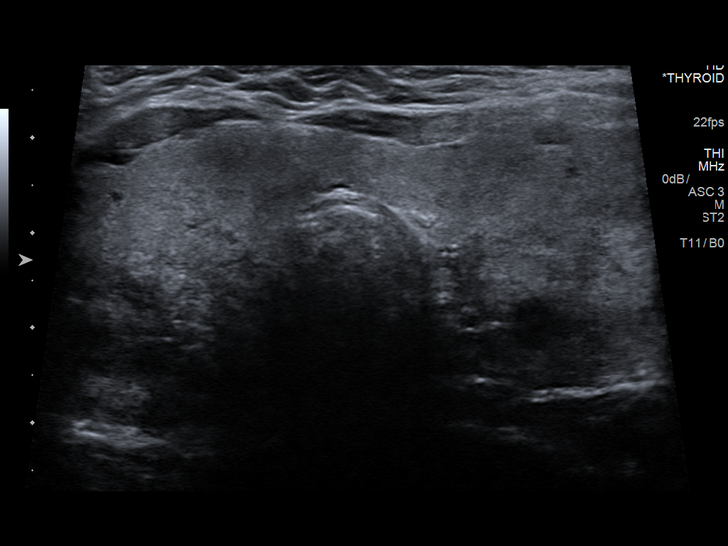
[im 10/75]
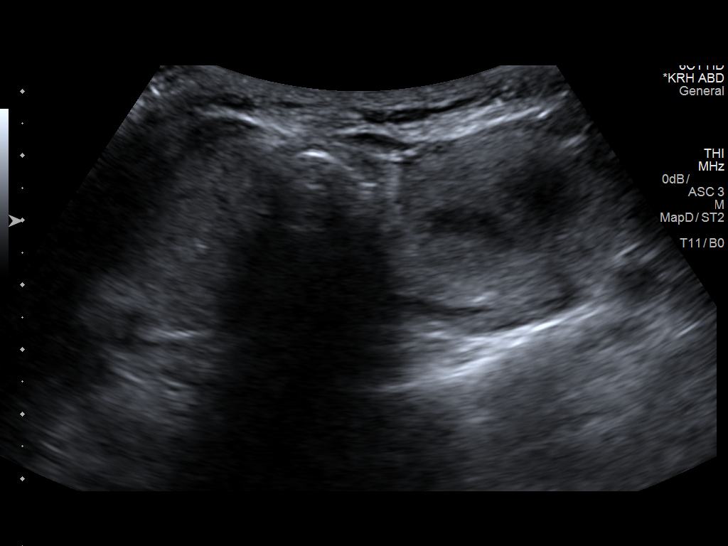
[im 16/75]
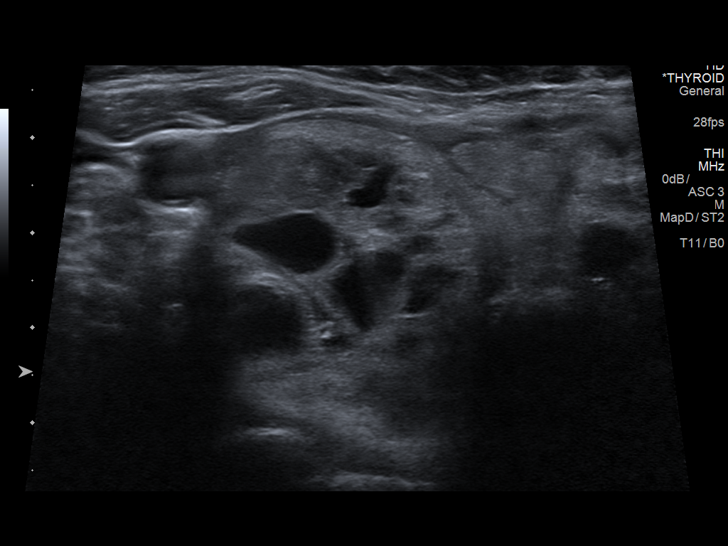
[im 22/75]
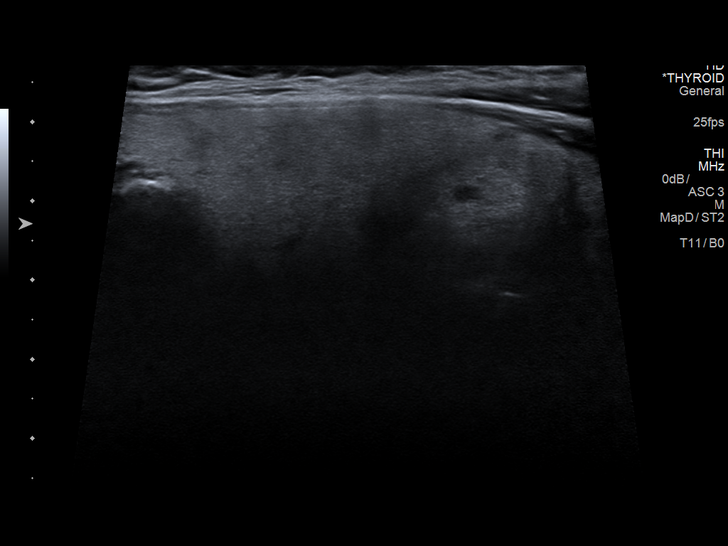
[im 28/75]
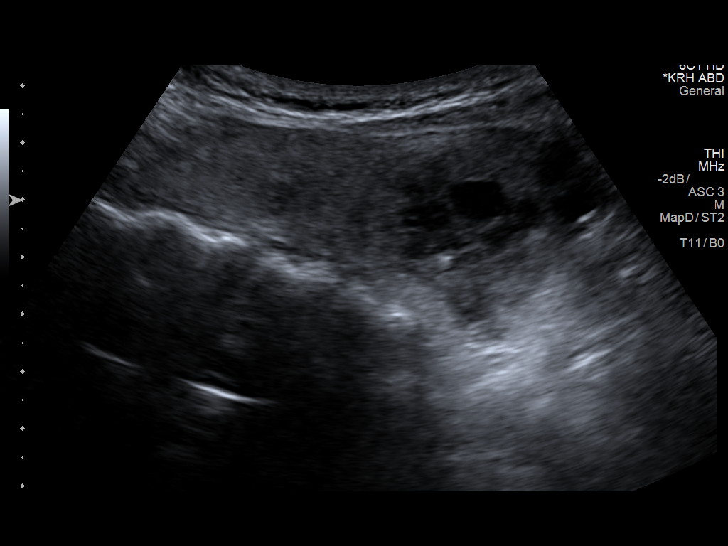
[im 34/75]
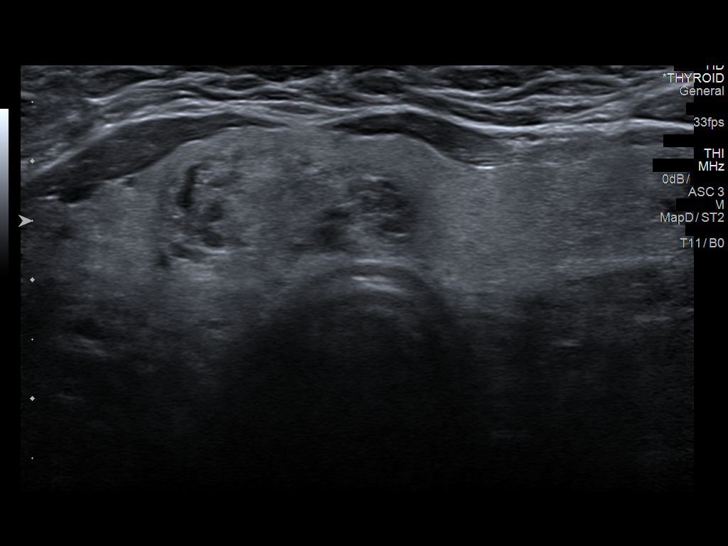
[im 41/75]
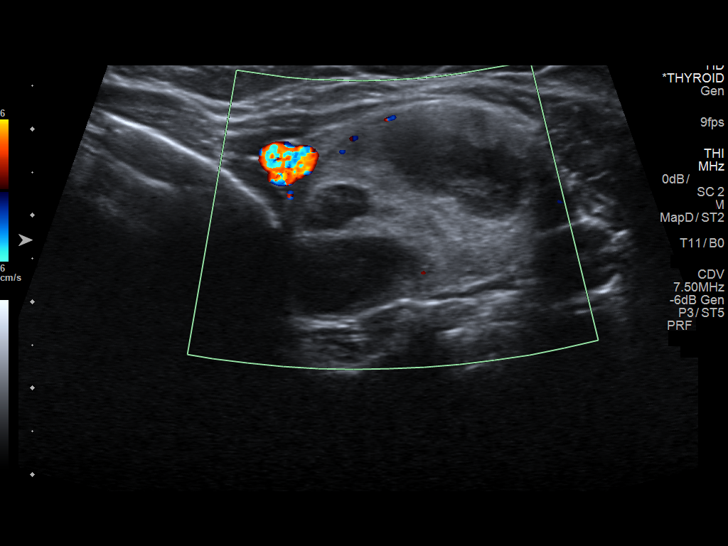
[im 47/75]
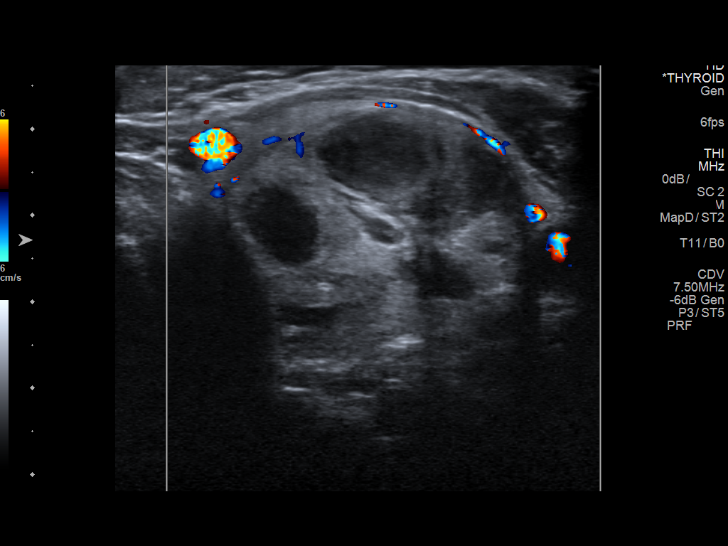
[im 53/75]
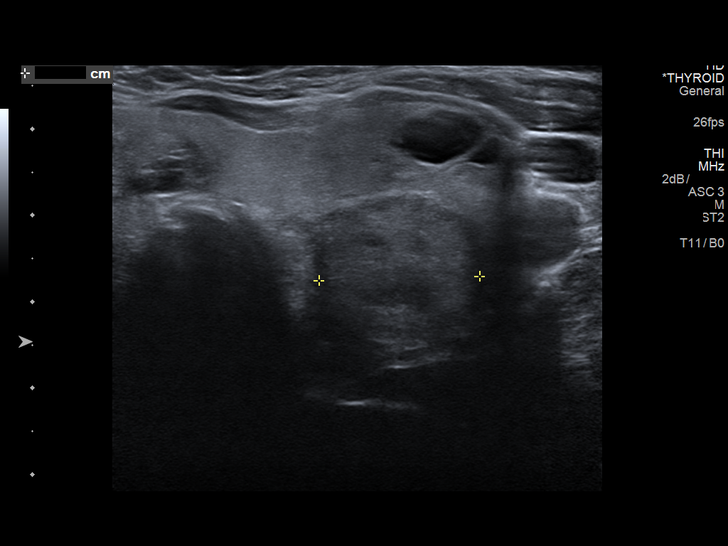
[im 59/75]
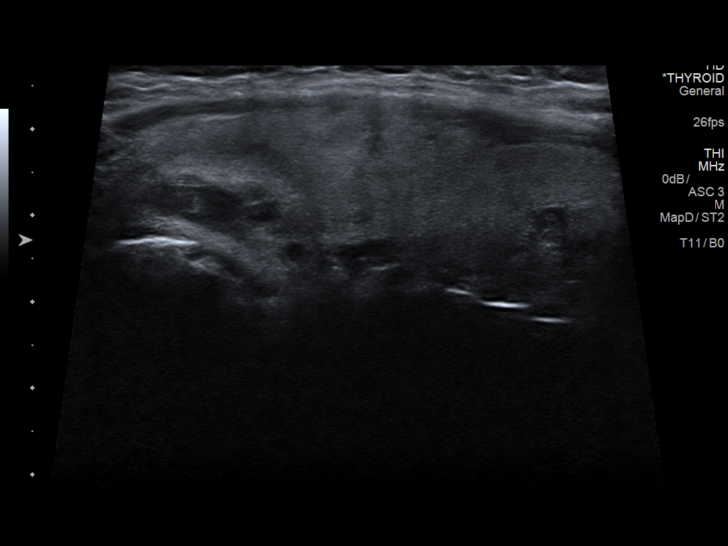
[im 65/75]
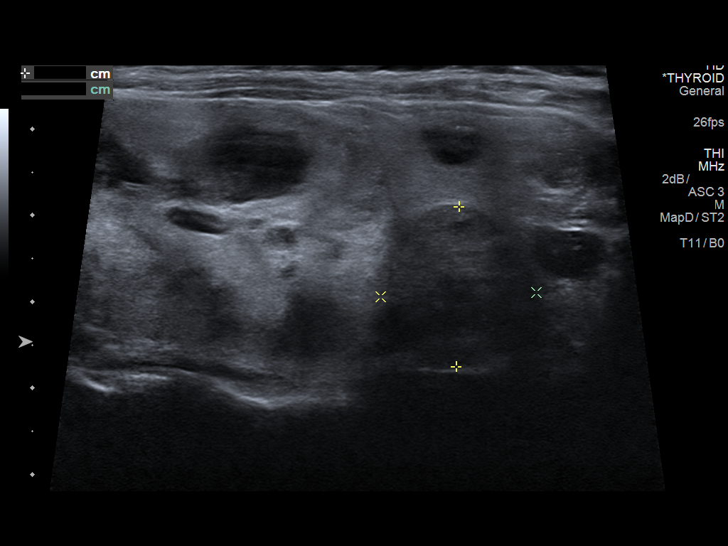
[im 71/75]
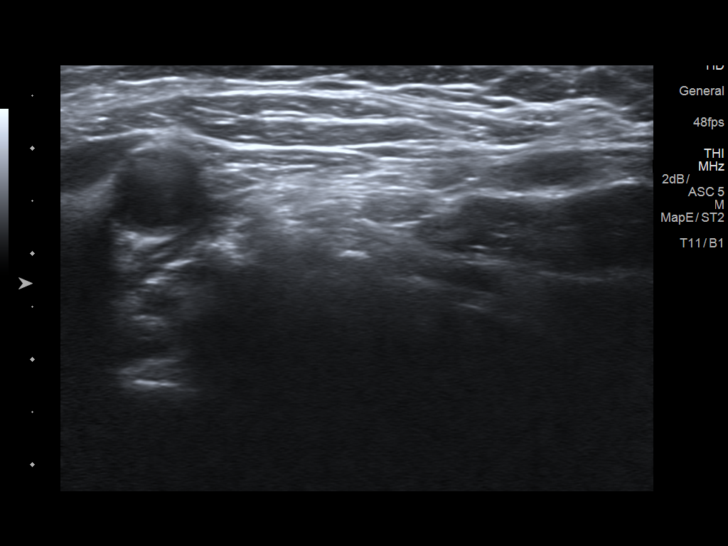

[12 of 25 positions shown; findings below may reference images not displayed]

FINDINGS: Parenchymal Echotexture: Mildly heterogenous

Estimated total number of nodules >/= 1 cm: 4

Number of spongiform nodules >/=  2 cm not described below (TR1): 0

Number of mixed cystic and solid nodules >/= 1.5 cm not described
below (TR2): 0

_________________________________________________________

Isthmus: Measures 1.2 cm in thickness.

Nodule # 1:

Location: Right; Mid, right side

Size: 2.6 x 1.3 x 2.2 cm.

Composition: solid/almost completely solid (2)

Echogenicity: hypoechoic (2)

Shape: not taller-than-wide (0)

Margins: ill-defined (0)

Echogenic foci: none (0)

ACR TI-RADS total points: 4.

ACR TI-RADS risk category: TR4 (4-6 points).

ACR TI-RADS recommendations:

**Given size (>/= 1.5 cm) and appearance, fine needle aspiration of
this moderately suspicious nodule should be considered based on
TI-RADS criteria.

_________________________________________________________

Right lobe: Measures 9.0 x 3.8 x 3.3 cm.

Nodule # 2:

Location: Right; Inferior

Size: 4.4 x 2.8 x 2.9 cm

Composition: solid/almost completely solid (2)

Echogenicity: hypoechoic (2)

Shape: not taller-than-wide (0)

Margins: ill-defined (0)

Echogenic foci: none (0)

ACR TI-RADS total points: 4.

ACR TI-RADS risk category: TR4 (4-6 points).

ACR TI-RADS recommendations:

**Given size (>/= 1.5 cm) and appearance, fine needle aspiration of
this moderately suspicious nodule should be considered based on
TI-RADS criteria.

_________________________________________________________

Left lobe: Measures 9.4 x 3.3 x 3.8 cm.

Nodule # 3:

Location: Left; Inferior

Size: 1.9 x 1.8 x 1.9 cm.

Composition: solid/almost completely solid (2)

Echogenicity: hypoechoic (2)

Shape: not taller-than-wide (0)

Margins: ill-defined (0)

Echogenic foci: none (0)

ACR TI-RADS total points: 4.

ACR TI-RADS risk category: TR4 (4-6 points).

ACR TI-RADS recommendations:

**Given size (>/= 1.5 cm) and appearance, fine needle aspiration of
this moderately suspicious nodule should be considered based on
TI-RADS criteria.

Nodule # 4:

Location: Left; Mid

Size: 4.8 x 2.9 3.4 cm.

Composition: solid/almost completely solid (2)

Echogenicity: hypoechoic (2)

Shape: not taller-than-wide (0)

Margins: ill-defined (0)

Echogenic foci: none (0)

ACR TI-RADS total points: 4.

ACR TI-RADS risk category: TR4 (4-6 points).

ACR TI-RADS recommendations:

**Given size (>/= 1.5 cm) and appearance, fine needle aspiration of
this moderately suspicious nodule should be considered based on
TI-RADS criteria.
IMPRESSION: Multi nodular goiter.

Four large dominant nodules. All of these nodules technically meet
criteria for biopsy. TI-RADS only recommends biopsying 2 of these
nodules at this time. Recommend biopsying the dominant left thyroid
nodule and the dominant right thyroid nodule based on their size.

Recommend 1 year follow-up of the other nodules.

The above is in keeping with the ACR TI-RADS recommendations - [HOSPITAL] 9284;[DATE].

## 2018-09-15 IMAGING — CT CT NECK W/ CM
4 series · 14 of 33 positions shown, 17 images · IV contrast (iopamidol)
Comparison: Cervical spine MRI 12/23/2013

CLINICAL DATA: Sudden swelling of his left thyroid lobe. Trouble
swallowing. Evaluate for thyroiditis.

EXAM:
CT NECK WITH CONTRAST
TECHNIQUE: Multidetector CT imaging of the neck was performed using the
standard protocol following the bolus administration of intravenous
contrast.
CONTRAST:  100mL 9K66MB-2BB IOPAMIDOL (9K66MB-2BB) INJECTION 61%

[Series 3: axial neck · axial · 0.56mm/px · z∈[-206,-54]mm · 5 of 115 slices shown, 7 images]
[im 20/115  soft-tissue]
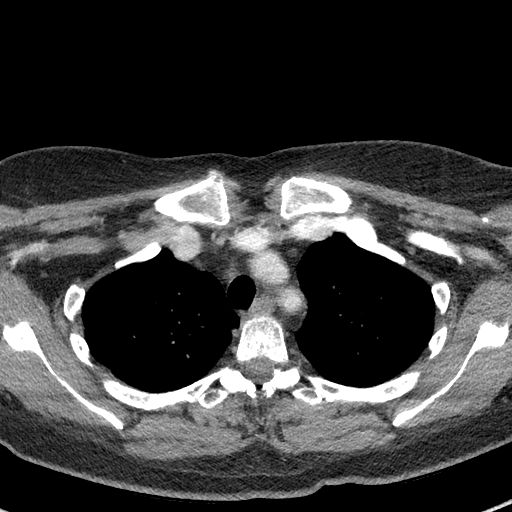
[im 20/115  bone]
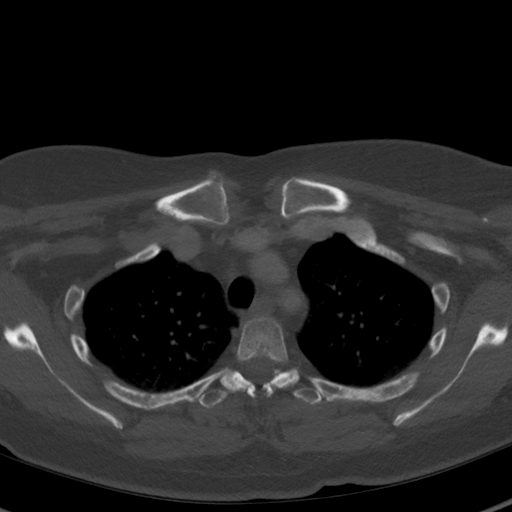
[im 39/115  bone]
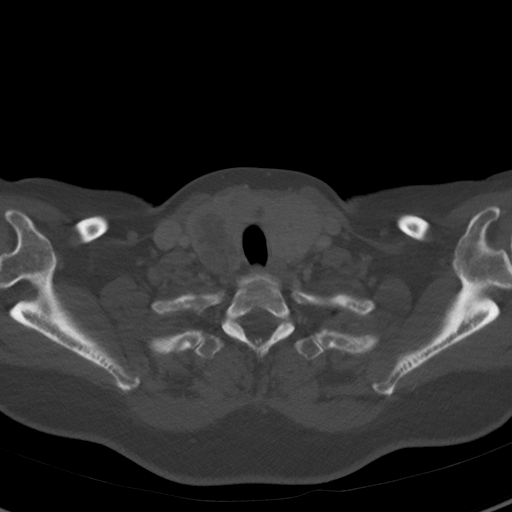
[im 58/115  bone]
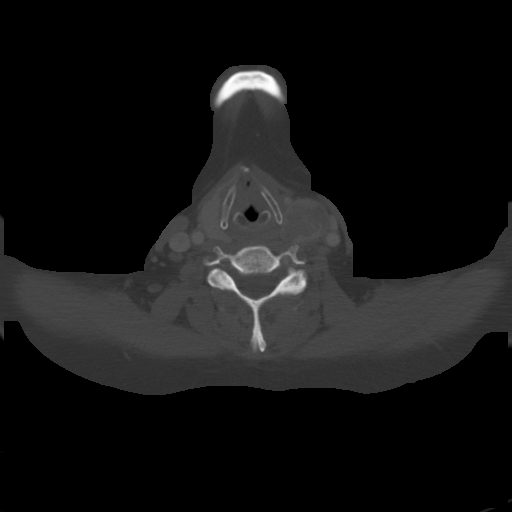
[im 77/115  bone]
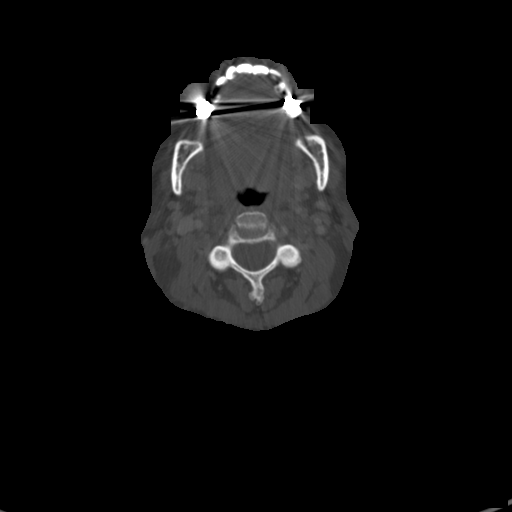
[im 96/115  soft-tissue]
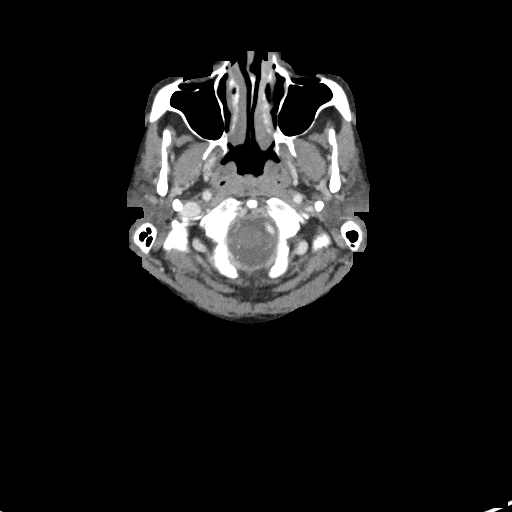
[im 96/115  bone]
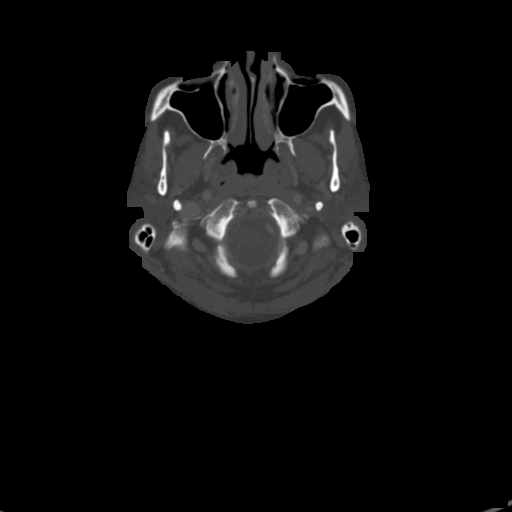

[Series 5: sag neck · sagittal · 0.54mm/px · 5 of 85 slices shown, 6 images]
[im 29/85  bone]
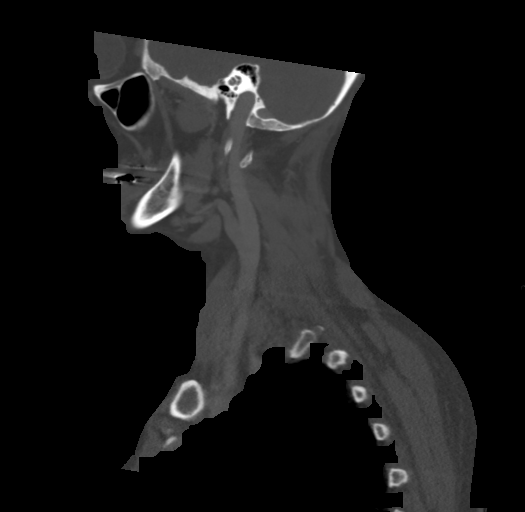
[im 36/85  bone]
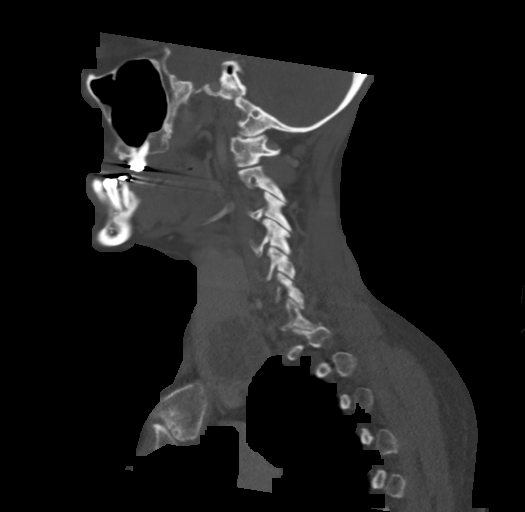
[im 43/85  soft-tissue]
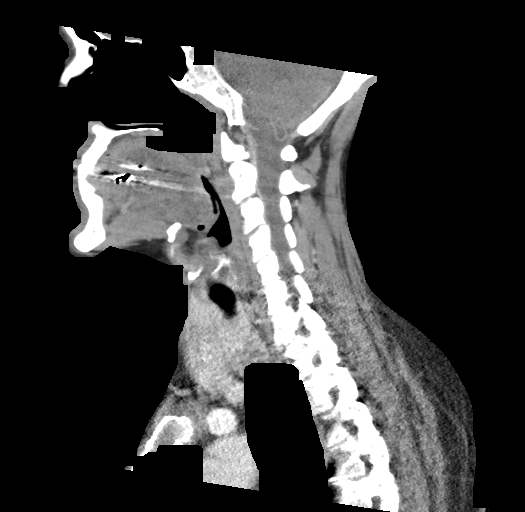
[im 43/85  bone]
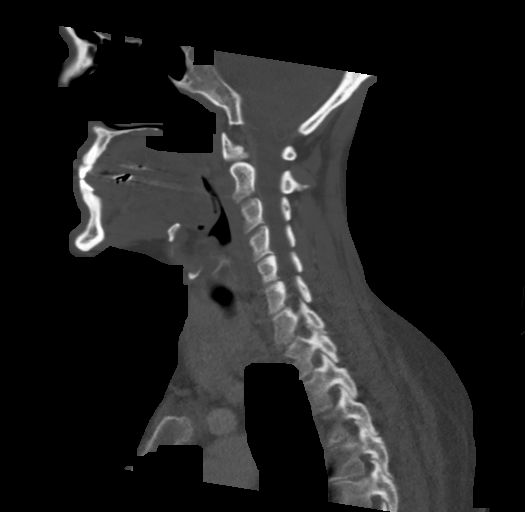
[im 50/85  bone]
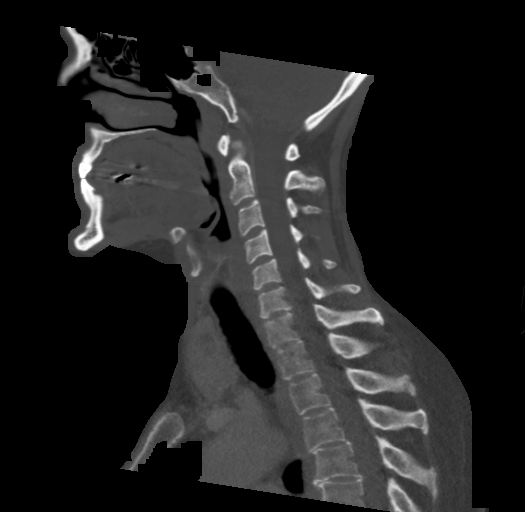
[im 57/85  bone]
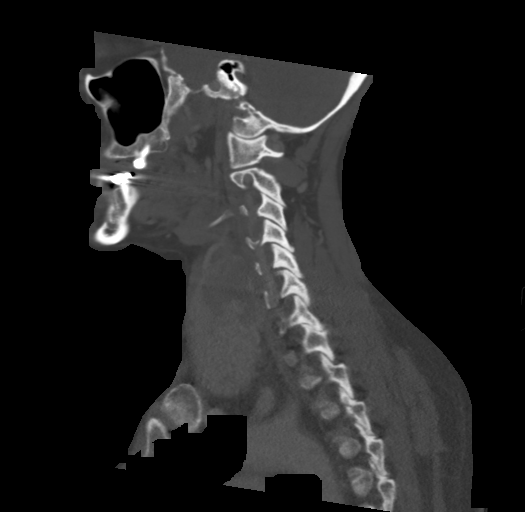

[Series 6: cor neck · coronal · 0.36mm/px · 3 of 143 slices shown]
[im 29/143  bone]
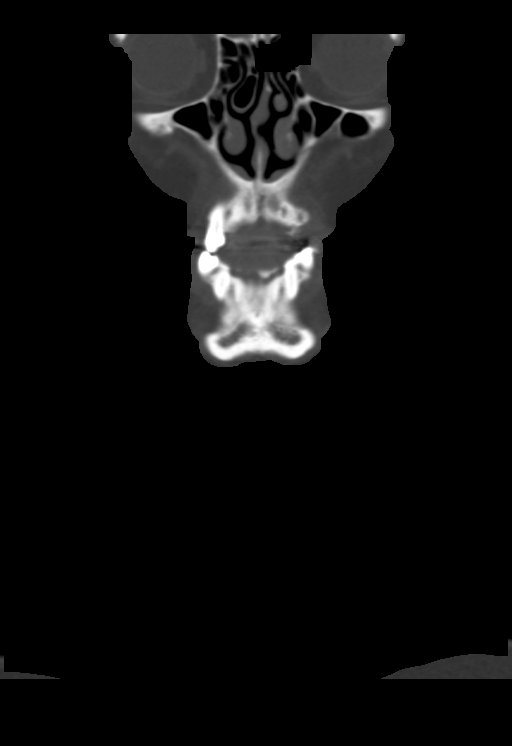
[im 57/143  bone]
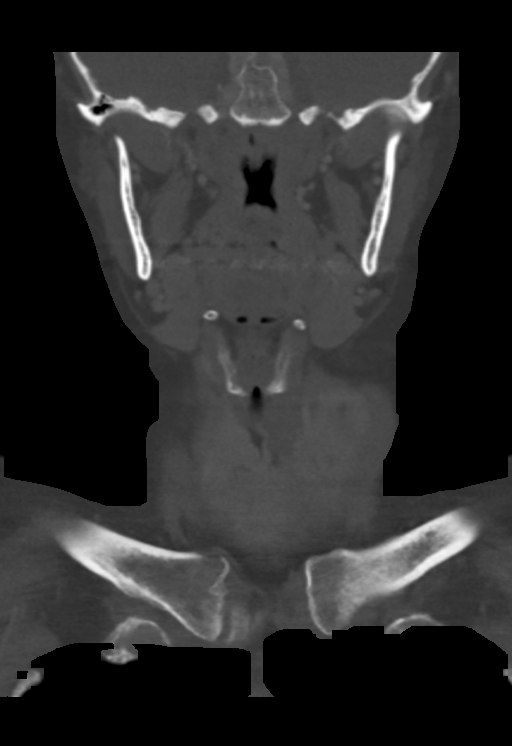
[im 86/143  bone]
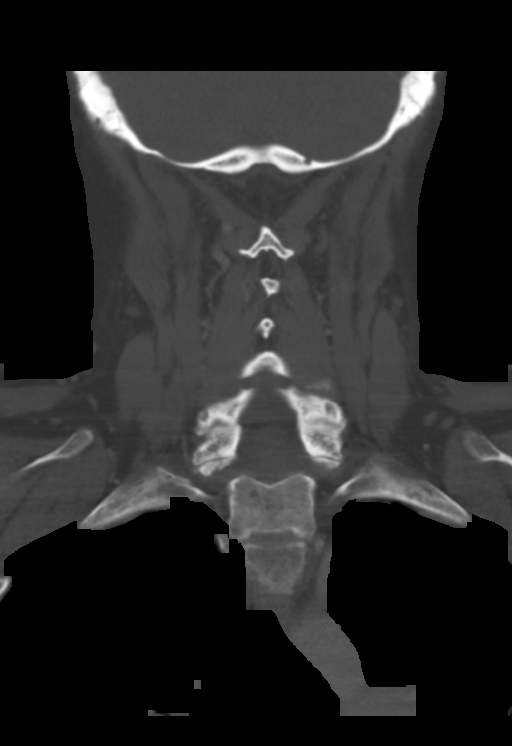

[Series 7: orthogonal ax · axial · 0.40mm/px · 1 of 126 slices shown]
[im 21/126  bone]
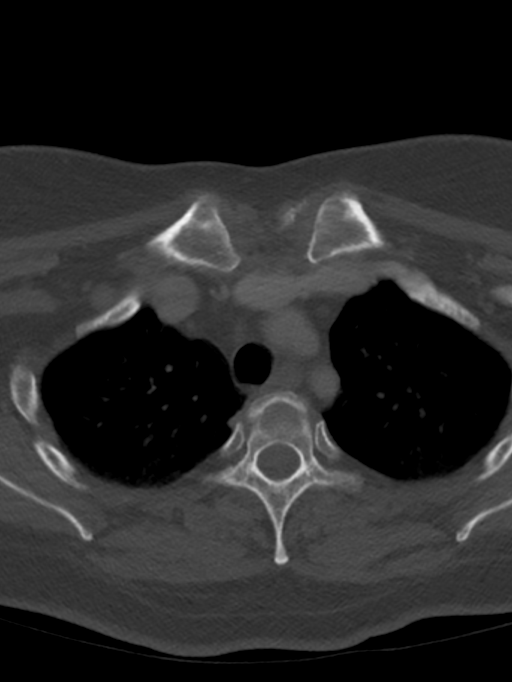

[14 of 33 positions shown; findings below may reference images not displayed]

FINDINGS: Pharynx and larynx: Normal. No mass or swelling.

Salivary glands: No inflammation, mass, or stone.

Thyroid: Goiter with multiple nodules. In the symptomatic left lobe
there is a nearly 4 cm nodule which has enlarged from 5869 MRI when
it measures up to 23 mm on axial images. This nodule has
heterogeneous internal density. Enhancing portions and intracystic
clot would have a similar appearance; morphology is overall
nonspecific.

A predominately cystic nodule in the right lobe inferiorly has
similar dimensions to prior at 36 mm. No inflammatory stranding or
generalized enlargement compared to prior to suggest thyroiditis.
Goiter has mild mass effect on the trachea without airway stenosis.

Lymph nodes: None enlarged or abnormal density.

Vascular: Mild atherosclerotic changes at the carotid bifurcation.
No acute finding.

Limited intracranial: Negative

Visualized orbits: Negative

Mastoids and visualized paranasal sinuses: Clear

Skeleton: No acute or aggressive finding

Upper chest: No active disease
IMPRESSION: Multinodular goiter. In the symptomatic left lobe the dominant
nodule has enlarged since 5869 and now measures up to 4 cm. Given
the history of sudden swelling, suspect intracystic hemorrhage. No
airway stenosis. Will need sonographic follow-up and consideration
of biopsy.
